# Patient Record
Sex: Female | Born: 2005 | Race: Black or African American | Hispanic: No | Marital: Single | Smoking: Never smoker
Health system: Southern US, Community
[De-identification: ages and names within clinical notes are randomized; demographics above are authoritative.]

## PROBLEM LIST (undated history)

## (undated) DIAGNOSIS — H5210 Myopia, unspecified eye: Secondary | ICD-10-CM

## (undated) DIAGNOSIS — F82 Specific developmental disorder of motor function: Secondary | ICD-10-CM

## (undated) DIAGNOSIS — E669 Obesity, unspecified: Secondary | ICD-10-CM

## (undated) DIAGNOSIS — D573 Sickle-cell trait: Secondary | ICD-10-CM

## (undated) DIAGNOSIS — L209 Atopic dermatitis, unspecified: Secondary | ICD-10-CM

## (undated) DIAGNOSIS — F909 Attention-deficit hyperactivity disorder, unspecified type: Secondary | ICD-10-CM

## (undated) DIAGNOSIS — J45909 Unspecified asthma, uncomplicated: Secondary | ICD-10-CM

## (undated) DIAGNOSIS — F845 Asperger's syndrome: Secondary | ICD-10-CM

## (undated) HISTORY — DX: Obesity, unspecified: E66.9

## (undated) HISTORY — DX: Myopia, unspecified eye: H52.10

## (undated) HISTORY — DX: Specific developmental disorder of motor function: F82

## (undated) HISTORY — PX: NO PAST SURGERIES: SHX2092

## (undated) HISTORY — DX: Attention-deficit hyperactivity disorder, unspecified type: F90.9

## (undated) HISTORY — DX: Atopic dermatitis, unspecified: L20.9

## (undated) HISTORY — DX: Asperger's syndrome: F84.5

## (undated) HISTORY — PX: WISDOM TOOTH EXTRACTION: SHX21

---
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
## 2024-01-19 ENCOUNTER — Ambulatory Visit

## 2024-01-19 DIAGNOSIS — J309 Allergic rhinitis, unspecified: Secondary | ICD-10-CM | POA: Diagnosis not present

## 2024-01-19 DIAGNOSIS — J302 Other seasonal allergic rhinitis: Secondary | ICD-10-CM

## 2024-01-23 ENCOUNTER — Ambulatory Visit (HOSPITAL_COMMUNITY): Admitting: Clinical

## 2024-01-23 ENCOUNTER — Encounter (HOSPITAL_COMMUNITY): Payer: Self-pay

## 2024-02-20 ENCOUNTER — Ambulatory Visit (INDEPENDENT_AMBULATORY_CARE_PROVIDER_SITE_OTHER)

## 2024-02-20 DIAGNOSIS — J309 Allergic rhinitis, unspecified: Secondary | ICD-10-CM

## 2024-02-21 ENCOUNTER — Ambulatory Visit (INDEPENDENT_AMBULATORY_CARE_PROVIDER_SITE_OTHER): Admitting: Clinical

## 2024-02-21 DIAGNOSIS — F331 Major depressive disorder, recurrent, moderate: Secondary | ICD-10-CM
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
## 2024-03-08 ENCOUNTER — Emergency Department (HOSPITAL_COMMUNITY)
Admission: EM | Admit: 2024-03-08 | Discharge: 2024-03-08 | Disposition: A | Discharge status: Home / Self Care | Attending: Emergency Medicine | Admitting: Emergency Medicine

## 2024-03-08 ENCOUNTER — Other Ambulatory Visit: Payer: Self-pay

## 2024-03-08 ENCOUNTER — Encounter (HOSPITAL_COMMUNITY): Payer: Self-pay | Admitting: Emergency Medicine

## 2024-03-08 DIAGNOSIS — R109 Unspecified abdominal pain: Secondary | ICD-10-CM

## 2024-03-08 DIAGNOSIS — R1084 Generalized abdominal pain: Secondary | ICD-10-CM | POA: Diagnosis present

## 2024-03-08 DIAGNOSIS — R7309 Other abnormal glucose: Secondary | ICD-10-CM | POA: Diagnosis not present

## 2024-03-08 DIAGNOSIS — R509 Fever, unspecified: Secondary | ICD-10-CM | POA: Diagnosis not present

## 2024-03-08 DIAGNOSIS — N898 Other specified noninflammatory disorders of vagina: Secondary | ICD-10-CM

## 2024-03-08 DIAGNOSIS — R110 Nausea: Secondary | ICD-10-CM

## 2024-03-08 DIAGNOSIS — J45909 Unspecified asthma, uncomplicated: Secondary | ICD-10-CM | POA: Insufficient documentation

## 2024-03-08 LAB — URINALYSIS, ROUTINE W REFLEX MICROSCOPIC
Bilirubin Urine: NEGATIVE
Glucose, UA: NEGATIVE mg/dL
Hgb urine dipstick: NEGATIVE
Ketones, ur: NEGATIVE mg/dL
Leukocytes,Ua: NEGATIVE
Nitrite: NEGATIVE
Protein, ur: NEGATIVE mg/dL
Specific Gravity, Urine: 1.016 (ref 1.005–1.030)
pH: 5 (ref 5.0–8.0)

## 2024-03-08 LAB — COMPREHENSIVE METABOLIC PANEL WITH GFR
ALT: 13 U/L (ref 0–44)
AST: 29 U/L (ref 15–41)
Albumin: 4.3 g/dL (ref 3.5–5.0)
Alkaline Phosphatase: 138 U/L — ABNORMAL HIGH (ref 38–126)
Anion gap: 9 (ref 5–15)
BUN: 11 mg/dL (ref 6–20)
CO2: 26 mmol/L (ref 22–32)
Calcium: 9.4 mg/dL (ref 8.9–10.3)
Chloride: 104 mmol/L (ref 98–111)
Creatinine, Ser: 0.73 mg/dL (ref 0.44–1.00)
GFR, Estimated: >60 mL/min
Glucose, Bld: 122 mg/dL — ABNORMAL HIGH (ref 70–99)
Potassium: 3.6 mmol/L (ref 3.5–5.1)
Sodium: 139 mmol/L (ref 135–145)
Total Bilirubin: 0.4 mg/dL (ref 0.0–1.2)
Total Protein: 7.8 g/dL (ref 6.5–8.1)

## 2024-03-08 LAB — CBC
HCT: 35.1 % — ABNORMAL LOW (ref 36.0–46.0)
Hemoglobin: 12 g/dL (ref 12.0–15.0)
MCH: 24.3 pg — ABNORMAL LOW (ref 26.0–34.0)
MCHC: 34.2 g/dL (ref 30.0–36.0)
MCV: 71.1 fL — ABNORMAL LOW (ref 80.0–100.0)
Platelets: 303 K/uL (ref 150–400)
RBC: 4.94 MIL/uL (ref 3.87–5.11)
RDW: 15.6 % — ABNORMAL HIGH (ref 11.5–15.5)
WBC: 9.8 K/uL (ref 4.0–10.5)
nRBC: 0 % (ref 0.0–0.2)

## 2024-03-08 LAB — WET PREP, GENITAL
Clue Cells Wet Prep HPF POC: NONE SEEN
Sperm: NONE SEEN
Trich, Wet Prep: NONE SEEN
WBC, Wet Prep HPF POC: <10
Yeast Wet Prep HPF POC: NONE SEEN

## 2024-03-08 LAB — RESP PANEL BY RT-PCR (RSV, FLU A&B, COVID)  RVPGX2
Influenza A by PCR: NEGATIVE
Influenza B by PCR: NEGATIVE
Resp Syncytial Virus by PCR: NEGATIVE
SARS Coronavirus 2 by RT PCR: NEGATIVE

## 2024-03-08 LAB — LIPASE, BLOOD: Lipase: 24 U/L (ref 11–51)

## 2024-03-08 LAB — HCG, SERUM, QUALITATIVE: Preg, Serum: NEGATIVE

## 2024-03-08 MED ORDER — ONDANSETRON HCL 4 MG PO TABS: 4.0000 mg | ORAL_TABLET | Freq: Four times a day (QID) | ORAL | 0 refills | Status: AC

## 2024-03-08 MED ORDER — IBUPROFEN 200 MG PO TABS
400.0000 mg | ORAL_TABLET | Freq: Once | ORAL | Status: AC | PRN
  Administered 2024-03-08: 400 mg via ORAL
  Filled 2024-03-08: qty 2

## 2024-03-08 MED ORDER — KETOROLAC TROMETHAMINE 15 MG/ML IJ SOLN: 15.0000 mg | Freq: Once | INTRAMUSCULAR | Status: DC

## 2024-03-08 MED ORDER — ONDANSETRON 4 MG PO TBDP
4.0000 mg | ORAL_TABLET | Freq: Once | ORAL | Status: AC
  Administered 2024-03-08: 4 mg via ORAL
  Filled 2024-03-08: qty 1

## 2024-03-08 MED ORDER — ACETAMINOPHEN 325 MG PO TABS
650.0000 mg | ORAL_TABLET | Freq: Once | ORAL | Status: AC
  Administered 2024-03-08: 650 mg via ORAL
  Filled 2024-03-08: qty 2

## 2024-03-08 NOTE — ED Provider Notes (Signed)
 " [place] EMERGENCY DEPARTMENT AT [facility] Provider Note   CSN: [id number] Arrival date & time: 03/08/24  9695     Patient presents with: Abdominal Pain   Kristin Robinson is a 19 y.o. female with PMHx anemia, asthma, dysmenorrhea who presents to ED concerned for generalized abdominal pain and nausea starting at 1AM this morning and resolving after 2 hours. Denies vomiting or diarrhea. Endorses fever. Denies rhinorrhea, congestion, cough, chest pain, SOB. Denies dysuria, hematuria. Denies vaginal discharge or concern for STD. Last BM 2 days ago and appeared normal. LMP around 2 weeks ago.   Patient currently has a little bit of nausea but is otherwise asymptomatic without any abdominal pain right now.    Abdominal Pain      Prior to Admission medications  Medication Sig Start Date End Date Taking? Authorizing Provider  acetaminophen  (TYLENOL ) 325 MG tablet Take 2 tablets (650 mg total) by mouth every 6 (six) hours as needed. 07/31/22   Elnor Jayson LABOR, DO  EPINEPHrine  0.3 mg/0.3 mL IJ SOAJ injection Inject 0.3 mg into the muscle as needed for anaphylaxis. 08/16/23   McDiarmid, Krystal BIRCH, MD  escitalopram  (LEXAPRO ) 10 MG tablet Take 1 tablet (10 mg total) by mouth daily. 10/08/23   Nwoko, Uchenna E, PA  ferrous sulfate  325 (65 FE) MG EC tablet Take 1 tablet (325 mg total) by mouth 3 (three) times daily with meals. 08/17/23   McDiarmid, Krystal BIRCH, MD  fluticasone  (FLONASE ) 50 MCG/ACT nasal spray Place 2 sprays into both nostrils daily. 04/25/23   Keith, Kayla N, PA-C  ipratropium-albuterol  (DUONEB) 0.5-2.5 (3) MG/3ML SOLN TAKE 3 MLS BY NEBULIZATION EVERY 4 (FOUR) HOURS AS NEEDED. 06/10/22   McDiarmid, Krystal BIRCH, MD  norgestimate -ethinyl estradiol  (SPRINTEC 28) 0.25-35 MG-MCG tablet Take 1 tablet by mouth daily. 03/16/23   Romelle Booty, MD  risperiDONE  (RISPERDAL ) 1 MG tablet Take 1 tablet (1 mg total) by mouth at bedtime. 10/08/23   Nwoko, Uchenna E, PA  sertraline  (ZOLOFT ) 25 MG tablet  Take 2 tablets (50 mg total) by mouth daily for 3 days, THEN 1 tablet (25 mg total) daily for 3 days. 09/17/23 09/23/23  Nwoko, Uchenna E, PA    Allergies: Patient has no known allergies.    Review of Systems  Gastrointestinal:  Positive for abdominal pain.    Updated Vital Signs BP 110/66   Pulse 76   Temp 100.2 F (37.9 C)   Resp 16   LMP 02/22/2024 (Approximate)   SpO2 100%   Physical Exam Vitals and nursing note reviewed.  Constitutional:      General: She is not in acute distress.    Appearance: She is not ill-appearing or toxic-appearing.  HENT:     Head: Normocephalic and atraumatic.     Mouth/Throat:     Mouth: Mucous membranes are moist.     Pharynx: No posterior oropharyngeal erythema.  Eyes:     General: No scleral icterus.       Right eye: No discharge.        Left eye: No discharge.     Conjunctiva/sclera: Conjunctivae normal.  Cardiovascular:     Rate and Rhythm: Normal rate and regular rhythm.     Pulses: Normal pulses.     Heart sounds: Normal heart sounds. No murmur heard. Pulmonary:     Effort: Pulmonary effort is normal. No respiratory distress.     Breath sounds: Normal breath sounds. No wheezing, rhonchi or rales.  Abdominal:     General:  Abdomen is flat. Bowel sounds are normal. There is no distension.     Palpations: Abdomen is soft. There is no mass.     Tenderness: There is no abdominal tenderness.  Musculoskeletal:     Right lower leg: No edema.     Left lower leg: No edema.  Skin:    General: Skin is warm and dry.     Findings: No rash.  Neurological:     General: No focal deficit present.     Mental Status: She is alert and oriented to person, place, and time. Mental status is at baseline.  Psychiatric:        Mood and Affect: Mood normal.        Behavior: Behavior normal.     (all labs ordered are listed, but only abnormal results are displayed) Labs Reviewed  COMPREHENSIVE METABOLIC PANEL WITH GFR - Abnormal; Notable for the  following components:      Result Value   Glucose, Bld 122 (*)    Alkaline Phosphatase 138 (*)    All other components within normal limits  CBC - Abnormal; Notable for the following components:   HCT 35.1 (*)    MCV 71.1 (*)    MCH 24.3 (*)    RDW 15.6 (*)    All other components within normal limits  RESP PANEL BY RT-PCR (RSV, FLU A&B, COVID)  RVPGX2  LIPASE, BLOOD  HCG, SERUM, QUALITATIVE  URINALYSIS, ROUTINE W REFLEX MICROSCOPIC    EKG: None  Radiology: No results found.   Procedures   Medications Ordered in the ED  ibuprofen  (ADVIL ) tablet 400 mg (400 mg Oral Given 03/08/24 0328)  ondansetron  (ZOFRAN -ODT) disintegrating tablet 4 mg (4 mg Oral Given 03/08/24 0601)  acetaminophen  (TYLENOL ) tablet 650 mg (650 mg Oral Given 03/08/24 9387)                                    Medical Decision Making Amount and/or Complexity of Data Reviewed Labs: ordered.  Risk OTC drugs. Prescription drug management.    This patient presents to the ED for concern of abdominal pain, this involves an extensive number of treatment options, and is a complaint that carries with it a high risk of complications and morbidity.  The differential diagnosis includes gastroenteritis, colitis, small bowel obstruction, appendicitis, cholecystitis, pancreatitis, nephrolithiasis, UTI, pyleonephritis  Co morbidities that complicate the patient evaluation  anemia, asthma, dysmenorrhea   Additional history obtained:  Dr. Wilder PCP   Problem List / ED Course / Critical interventions / Medication management  Patient presented for 2-hour episode of abdominal pain around 1 AM this morning.  Patient currently with nausea but denies any vomiting.  Physical exam is reassuring.  Patient afebrile with stable vitals. I Ordered, and personally interpreted labs.  hCG negative.  CBC without leukocytosis or anemia.  CMP with mild hyperglycemia 122.  Lipase within normal limits.  UA pending.  Respiratory  panel pending. Shared results with patient.  Answered all questions.  Currently, patient's only symptom is nausea.  No abdominal pain.  Shared decision making with patient who would like to try Zofran  to make sure that symptoms resolve.  Will reevaluate her after labs result. I have reviewed the patients home medicines and have made adjustments as needed   Social Determinants of Health:  none  6:38 AM Care of Eldred Lievanos transferred to [facility] at the end of my shift as  the patient will require reassessment once labs/imaging have resulted. Patient presentation, ED course, and plan of care discussed with review of all pertinent labs and imaging. Please see his/her note for further details regarding further ED course and disposition. Plan at time of handoff is reassess patient after p.o. challenge and lab results. This may be altered or completely changed at the discretion of the oncoming team pending results of further workup.      Final diagnoses:  None    ED Discharge Orders     None          Hoy Nidia FALCON, [place] 03/08/24 9361    Midge Golas, MD 03/08/24 [phone]  "

## 2024-03-08 NOTE — ED Provider Notes (Signed)
 Care assumed from [facility] at shift change, please see their note for full detail, but in brief Kristin Robinson is a 19 y.o. female presents with abdominal pain and nausea that started suddenly last night, by the time she was evaluated in the ED her abdominal pain had completely resolved but she still complained of some nausea.  She not had any vomiting.  When abdominal pain was present it is generalized.  No other associated symptoms.  Plan: Patient received Zofran  and Tylenol , abdominal exam was benign, follow-up on lab work and anticipate discharge home  Physical Exam  BP 102/69   Pulse 74   Temp 98.3 F (36.8 C) (Oral)   Resp 16   LMP 02/22/2024 (Approximate)   SpO2 100%   Physical Exam Vitals and nursing note reviewed.  Constitutional:      General: She is not in acute distress.    Appearance: Normal appearance. She is well-developed. She is not ill-appearing or diaphoretic.  HENT:     Head: Normocephalic and atraumatic.  Eyes:     General:        Right eye: No discharge.        Left eye: No discharge.  Pulmonary:     Effort: Pulmonary effort is normal. No respiratory distress.  Abdominal:     General: Bowel sounds are normal.     Tenderness: There is no abdominal tenderness.  Neurological:     Mental Status: She is alert and oriented to person, place, and time.     Coordination: Coordination normal.  Psychiatric:        Mood and Affect: Mood normal.        Behavior: Behavior normal.     Procedures  Procedures  ED Course / MDM   Labs Reviewed  COMPREHENSIVE METABOLIC PANEL WITH GFR - Abnormal; Notable for the following components:      Result Value   Glucose, Bld 122 (*)    Alkaline Phosphatase 138 (*)    All other components within normal limits  CBC - Abnormal; Notable for the following components:   HCT 35.1 (*)    MCV 71.1 (*)    MCH 24.3 (*)    RDW 15.6 (*)    All other components within normal limits  URINALYSIS, ROUTINE W REFLEX  MICROSCOPIC - Abnormal; Notable for the following components:   APPearance HAZY (*)    All other components within normal limits  RESP PANEL BY RT-PCR (RSV, FLU A&B, COVID)  RVPGX2  WET PREP, GENITAL  LIPASE, BLOOD  HCG, SERUM, QUALITATIVE     Medical Decision Making Amount and/or Complexity of Data Reviewed Labs: ordered.  Risk OTC drugs. Prescription drug management.   Patient's lab work is overall reassuring without leukocytosis, anemia or electrolyte derangement, normal renal and liver function.  Urinalysis without signs of infection.  Patient did mention to me some occasional white vaginal discharge, she is not sexually active but wondered if this could be a yeast infection.  Had patient perform self swab for wet prep and it did not show evidence of yeast BV or other abnormalities.  Suspect this is physiologic discharge encourage patient to follow-up with primary care if not resolving.  Prescribed Zofran , abdominal exam remains nontender.  Will discharge home with strict return precautions and outpatient follow-up.       Alva Larraine FALCON, PA-C 03/08/24 0850    Dasie Faden, MD 03/08/24 [phone]

## 2024-03-08 NOTE — Discharge Instructions (Addendum)
 Your lab work today was reassuring and I am glad your abdominal pain has stopped.  You can use prescribed Zofran  as needed for any nausea or vomiting.  Follow-up with your primary care doctor if symptoms persist.

## 2024-03-08 NOTE — ED Triage Notes (Signed)
 Pt arrives w/ c/o generalized abd pain that woke her out of her sleep. Also c/o nausea, chills and cough. All began at 1am.

## 2024-03-09 ENCOUNTER — Ambulatory Visit (INDEPENDENT_AMBULATORY_CARE_PROVIDER_SITE_OTHER): Admitting: Clinical

## 2024-03-09 DIAGNOSIS — F331 Major depressive disorder, recurrent, moderate: Secondary | ICD-10-CM | POA: Diagnosis not present

## 2024-03-10 NOTE — Progress Notes (Signed)
 "  THERAPIST PROGRESS NOTE Virtual Visit via Video Note  I connected with Kristin Robinson on 03/09/2024 at 11:00 AM EST by a video enabled telemedicine application and verified that I am speaking with the correct person using two identifiers.  Location: Patient: home Provider: remote office- home   I discussed the limitations of evaluation and management by telemedicine and the availability of in person appointments. The patient expressed understanding and agreed to proceed.   Follow Up Instructions: I discussed the assessment and treatment plan with the patient. The patient was provided an opportunity to ask questions and all were answered. The patient agreed with the plan and demonstrated an understanding of the instructions.   The patient was advised to call back or seek an in-person evaluation if the symptoms worsen or if the condition fails to improve as anticipated.    Session Time: 25 min  Participation Level: Active  Behavioral Response: CasualAlertAnxious  Type of Therapy: Individual Therapy  Treatment Goals addressed: client will engage in at least 80% of scheduled individual psychotherapy sessions  ProgressTowards Goals: Progressing  Interventions: CBT and Supportive  Summary:  Kristin Robinson is a 19 y.o. female who presents for the scheduled appointment oriented times five, appropriately dressed and friendly. Client denied hallucinations and delusions. Client reported she is fairly okay. Client reported she and her mother went to court this morning regarding her sister. Client reported her sister was hospitalized due to her behaviors. Client reported her sister was destroying property in the home, making threats and became combative with her and their family members. Client reported her mother went to court to see about a restraining order but was told they nust wait for when her sister can go to court and state her case. Client reported she and her mother are  also is seeking to do the eviction notice for her sister. Client reported she is at her grandparents house because the door is broken at her home. 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 Client reported this doe snot include suicide thoughts. Client reported she needs to reestablish care with the psychiatrist to reassess her medications.  Suicidal/Homicidal: Nowithout intent/plan  Therapist Response:  Therapist began the appointment asking the client how she has been doing. Therapist engaged with active listening and positive emotional support. Therapist used cbt to ask clarifying questions about the severity of her depressive symptoms. Therapist used cbt to discuss behavioral skills ot use to help with depression and discussed walk in to be seen by a psych provider for medications. Therapist used CBT ask the client to identify her progress with frequency of use with coping skills with continued practice in her daily activity.      Plan: Return again in 3 weeks.  Diagnosis: mdd, recurrent episode, moderate  Collaboration of Care: Patient refused AEB none requested by the client.  Patient/Guardian was advised Release of Information must be obtained prior to any record release in order to collaborate their care with an outside provider. Patient/Guardian was advised if they have not already done so to contact the registration department to sign all necessary forms in order for us  to release information regarding their care.   Consent: Patient/Guardian gives verbal consent for treatment and assignment of benefits for services provided during this visit. Patient/Guardian expressed understanding and agreed to proceed.   Creasie Lacosse  Y Amyjo Mizrachi, LCSW 03/09/2024  "

## 2024-03-14 NOTE — Progress Notes (Signed)
 VIALS MADE ON 03/14/24

## 2024-03-20 ENCOUNTER — Ambulatory Visit

## 2024-03-20 DIAGNOSIS — J302 Other seasonal allergic rhinitis: Secondary | ICD-10-CM | POA: Diagnosis not present

## 2024-03-27 ENCOUNTER — Ambulatory Visit (HOSPITAL_COMMUNITY): Admitting: Clinical

## 2024-04-26 ENCOUNTER — Ambulatory Visit: Admitting: Family Medicine
